# Patient Record
Sex: Female | Born: 2000 | Race: White | Hispanic: No | Marital: Single | Smoking: Never smoker
Health system: Southern US, Community
[De-identification: ages and names within clinical notes are randomized; demographics above are authoritative.]

---
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
## 2021-03-19 ENCOUNTER — Encounter (INDEPENDENT_AMBULATORY_CARE_PROVIDER_SITE_OTHER): Payer: Self-pay

## 2021-03-19 ENCOUNTER — Ambulatory Visit: Payer: No Typology Code available for payment source | Admitting: Podiatry

## 2021-03-19 ENCOUNTER — Encounter: Payer: Self-pay | Admitting: Podiatry

## 2021-03-19 ENCOUNTER — Other Ambulatory Visit: Payer: Self-pay

## 2021-03-19 DIAGNOSIS — L603 Nail dystrophy: Secondary | ICD-10-CM

## 2021-03-19 MED ORDER — TERBINAFINE HCL 250 MG PO TABS: 250.0000 mg | ORAL_TABLET | Freq: Every day | ORAL | 0 refills | Status: DC

## 2021-03-19 NOTE — Progress Notes (Signed)
She presents today for follow-up of her onychomycosis and her hallux nails bilaterally.  States that she has been taking her Lamisil every other day with no problems.  She states that that her toenails are looking better but are still dark and slow-growing.  Objective: Vital signs stable alert and oriented x3.  There is no erythema edema cellulitis drainage or odor the nails still have longitudinal lines discolored are moderately thickened.  They appear to be better than they were particularly lighter in color but no definitive line of demarcation.  Assessment: Onychomycosis long-term therapy with Lamisil.  Plan: We are going to try 1 more every other day dose with Lamisil should this fail to improve dramatically then we will consider discontinuing medication.

## 2021-06-25 ENCOUNTER — Other Ambulatory Visit: Payer: Self-pay

## 2021-06-25 ENCOUNTER — Encounter: Payer: Self-pay | Admitting: Podiatry

## 2021-06-25 ENCOUNTER — Ambulatory Visit: Payer: No Typology Code available for payment source | Admitting: Podiatry

## 2021-06-25 DIAGNOSIS — L603 Nail dystrophy: Secondary | ICD-10-CM | POA: Diagnosis not present

## 2021-06-25 MED ORDER — TERBINAFINE HCL 250 MG PO TABS: 250.0000 mg | ORAL_TABLET | Freq: Every day | ORAL | 0 refills | Status: DC

## 2021-06-25 NOTE — Progress Notes (Signed)
She presents today for follow-up of her nail fungus.  Her right great toe she states seems to be clearing as she had no problems taking the medication.  Her left great toenail however is still thick and not very attractive she says she is like to have it removed possibly next visit.  Objective: Vital signs are stable alert oriented x3 pulses remain palpable hallux right is greater than 50% grown out however the hallux left does not appear to have grown much at all and still remains discolored thickened and tender on palpation.  Assessment: Pain in limb secondary to onychomycosis long-term therapy with Lamisil is clearing the right hallux.  Plan: She will continue an every other day dose of the Lamisil.  I am going to follow-up with her in 3 months at which time we will remove the hallux nail permanently on the left foot.

## 2021-06-25 NOTE — Patient Instructions (Signed)
Dr. Hyatt has sent over a refill for Lamisil to your pharmacy today. The instructions on your bottle will say "take 1 tablet daily", however, he would like for you to take one pill every other day. He will follow up with you in 3 months to re-evaluate your toenails. 

## 2021-06-30 ENCOUNTER — Ambulatory Visit: Payer: No Typology Code available for payment source | Admitting: Podiatry

## 2021-09-01 IMAGING — DX DG CHEST 1V PORT
1 series · 1 of 1 positions shown · non-contrast
Comparison: None.

CLINICAL DATA: Chest pain

EXAM:
PORTABLE CHEST 1 VIEW

[chest ap]
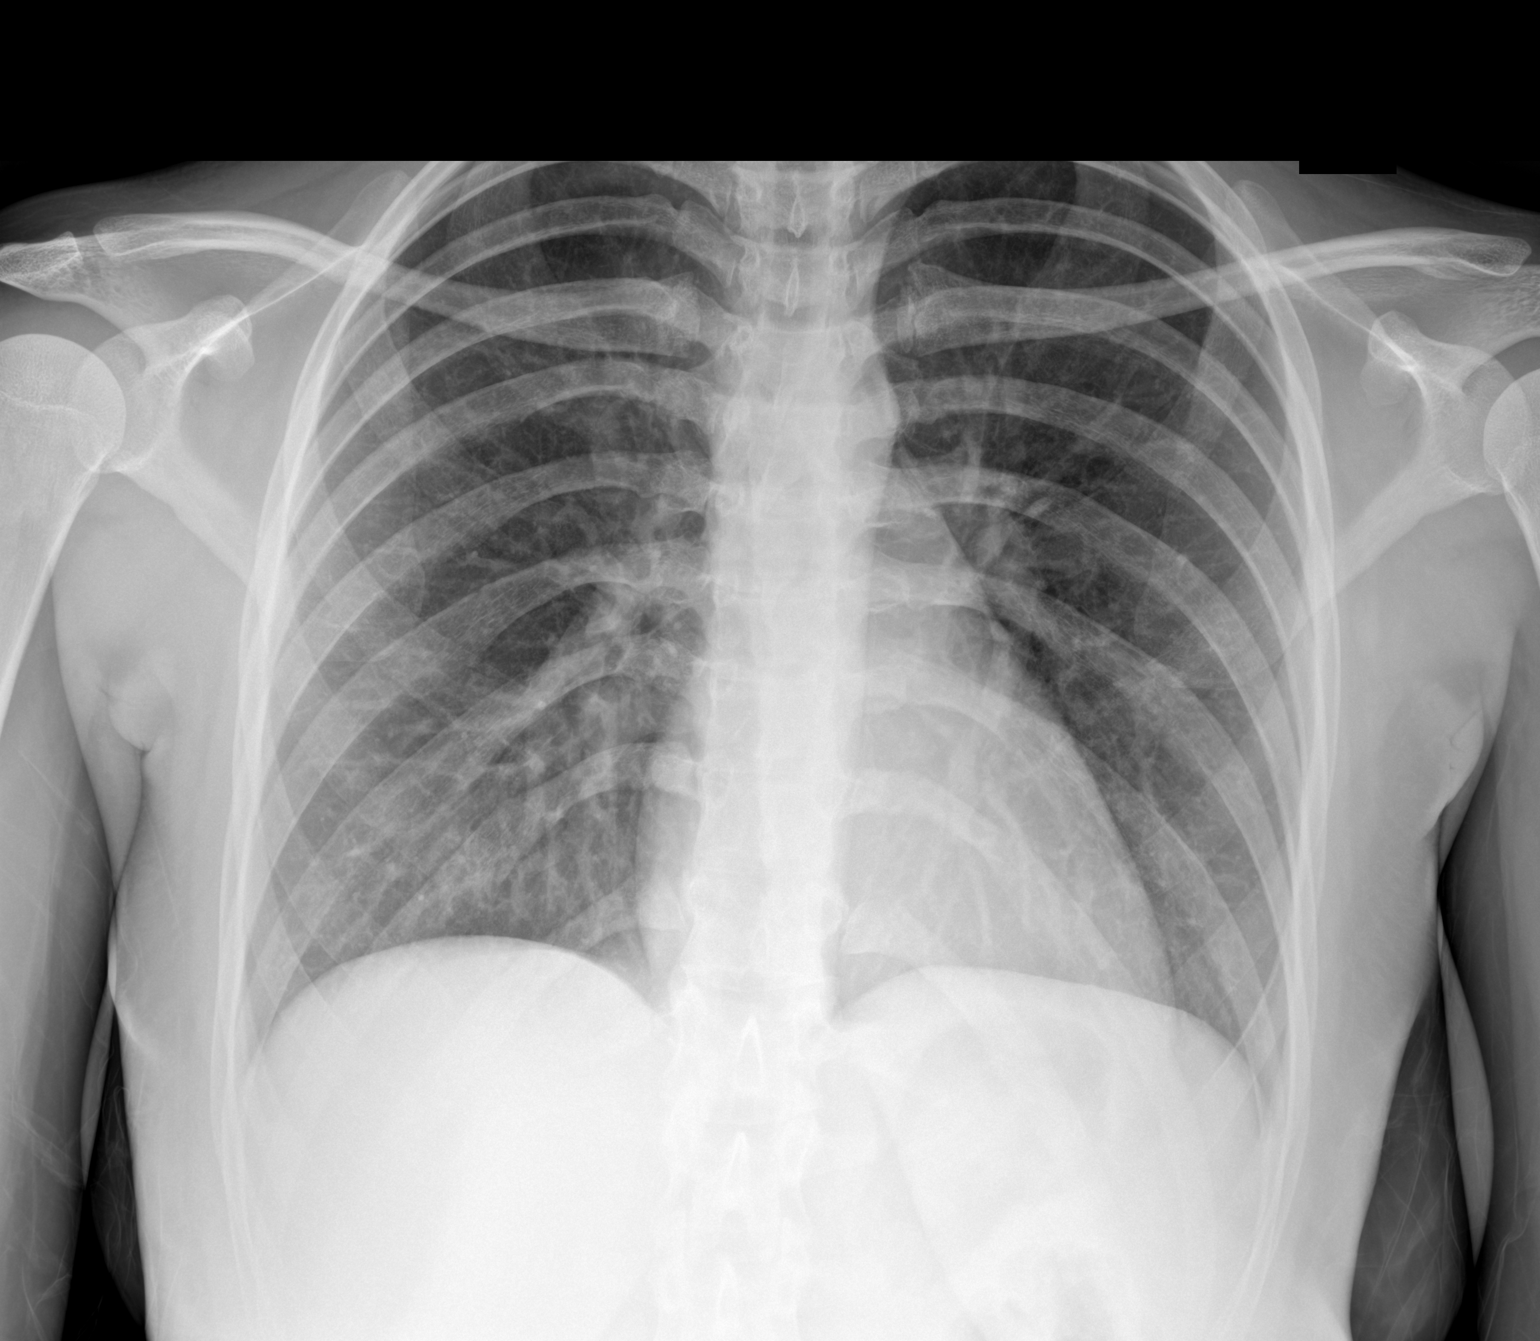

[1 of 1 positions shown; findings below may reference images not displayed]

FINDINGS: The heart size and mediastinal contours are within normal limits.
Both lungs are clear. The visualized skeletal structures are
unremarkable.
IMPRESSION: Normal study.

## 2021-09-24 ENCOUNTER — Encounter: Payer: Self-pay | Admitting: Podiatry

## 2021-09-24 ENCOUNTER — Other Ambulatory Visit: Payer: Self-pay

## 2021-09-24 ENCOUNTER — Ambulatory Visit: Payer: No Typology Code available for payment source | Admitting: Podiatry

## 2021-09-24 DIAGNOSIS — L600 Ingrowing nail: Secondary | ICD-10-CM

## 2021-09-24 DIAGNOSIS — L603 Nail dystrophy: Secondary | ICD-10-CM

## 2021-09-24 MED ORDER — NEOMYCIN-POLYMYXIN-HC 1 % OT SOLN: OTIC | 1 refills | Status: AC

## 2021-09-24 NOTE — Progress Notes (Signed)
She presents today states that she is ready to have her toenail removed she was follow-up of Lamisil but does not want to take the medication any longer she states that it really does not seem to be helping and the nail is really not growing.  She is referring to the hallux left. ? ?Objective: Vital signs stable alert oriented x3 there is no erythema edema cellulitis drainage or odor nail dystrophy which is painful hallux left. ? ?Assessment nail dystrophy hallux left painful in nature. ? ?Plan: Total matrixectomy was performed today hallux left after local anesthetic was administered she tolerated procedure well without complications.  She was given both oral and written home-going structure for care and soaking of the toe as well as a prescription for Cortisporin Otic to be applied twice daily after soaking.  We will follow-up with her in 2 weeks. ?

## 2021-09-24 NOTE — Patient Instructions (Signed)

## 2021-10-08 ENCOUNTER — Ambulatory Visit: Payer: No Typology Code available for payment source | Admitting: Podiatry

## 2021-10-08 ENCOUNTER — Encounter: Payer: Self-pay | Admitting: Podiatry

## 2021-10-08 DIAGNOSIS — Z9889 Other specified postprocedural states: Secondary | ICD-10-CM

## 2021-10-08 DIAGNOSIS — L600 Ingrowing nail: Secondary | ICD-10-CM

## 2021-10-08 NOTE — Progress Notes (Signed)
She presents today for follow-up of her total nail avulsion with matrixectomy hallux left.  States that it was initially sore but now is doing really well. ? ?Objective: Vital signs are stable she is alert and oriented x3.  Pulses are palpable.  There is no erythema edema cellulitis drainage or odor nailbed appears to be healing very nicely. ? ?Assessment: Well-healing surgical toe. ? ?Plan: Continue to soak for about another week Epsom salts and warm water and then start applying moisturizer to the nailbed. ?
